# Patient Record
Sex: Female | Born: 1980 | Race: White | Hispanic: No | Marital: Single | Smoking: Former smoker
Health system: Southern US, Community
[De-identification: ages and names within clinical notes are randomized; demographics above are authoritative.]

## PROBLEM LIST (undated history)

## (undated) DIAGNOSIS — E119 Type 2 diabetes mellitus without complications: Secondary | ICD-10-CM

## (undated) HISTORY — DX: Type 2 diabetes mellitus without complications: E11.9

---

## 1999-05-22 ENCOUNTER — Encounter: Admission: RE | Admit: 1999-05-22 | Discharge: 1999-05-22 | Payer: Self-pay | Admitting: Sports Medicine

## 1999-05-22 ENCOUNTER — Encounter: Admission: RE | Admit: 1999-05-22 | Discharge: 1999-05-22 | Payer: Self-pay | Admitting: Family Medicine

## 2001-03-03 ENCOUNTER — Encounter: Admission: RE | Admit: 2001-03-03 | Discharge: 2001-03-03 | Payer: Self-pay | Admitting: Sports Medicine

## 2001-04-18 ENCOUNTER — Encounter: Payer: Self-pay | Admitting: Emergency Medicine

## 2001-04-18 ENCOUNTER — Emergency Department (HOSPITAL_COMMUNITY): Admission: EM | Admit: 2001-04-18 | Discharge: 2001-04-19 | Payer: Self-pay | Admitting: Emergency Medicine

## 2007-05-09 ENCOUNTER — Inpatient Hospital Stay (HOSPITAL_COMMUNITY): Admission: AD | Admit: 2007-05-09 | Discharge: 2007-05-11 | Payer: Self-pay | Admitting: Obstetrics & Gynecology

## 2010-08-16 ENCOUNTER — Encounter: Payer: BC Managed Care – PPO | Attending: Certified Nurse Midwife

## 2010-08-16 DIAGNOSIS — O99810 Abnormal glucose complicating pregnancy: Secondary | ICD-10-CM | POA: Insufficient documentation

## 2010-08-16 DIAGNOSIS — Z713 Dietary counseling and surveillance: Secondary | ICD-10-CM | POA: Insufficient documentation

## 2010-10-18 ENCOUNTER — Inpatient Hospital Stay (HOSPITAL_COMMUNITY)
Admission: AD | Admit: 2010-10-18 | Discharge: 2010-10-19 | DRG: 372 | Disposition: A | Discharge status: Home / Self Care | Payer: BC Managed Care – PPO | Source: Ambulatory Visit | Attending: Obstetrics & Gynecology | Admitting: Obstetrics & Gynecology

## 2010-10-18 DIAGNOSIS — O9903 Anemia complicating the puerperium: Secondary | ICD-10-CM | POA: Diagnosis not present

## 2010-10-18 DIAGNOSIS — D62 Acute posthemorrhagic anemia: Secondary | ICD-10-CM | POA: Diagnosis not present

## 2010-10-18 DIAGNOSIS — O99814 Abnormal glucose complicating childbirth: Secondary | ICD-10-CM | POA: Diagnosis present

## 2010-10-19 LAB — CBC
HCT: 26.4 % — ABNORMAL LOW (ref 36.0–46.0)
Hemoglobin: 9 g/dL — ABNORMAL LOW (ref 12.0–15.0)
MCH: 30.8 pg (ref 26.0–34.0)
MCHC: 34.1 g/dL (ref 30.0–36.0)
MCV: 90.4 fL (ref 78.0–100.0)
Platelets: 140 10*3/uL — ABNORMAL LOW (ref 150–400)
RBC: 2.92 MIL/uL — ABNORMAL LOW (ref 3.87–5.11)
RDW: 13.2 % (ref 11.5–15.5)
WBC: 9 10*3/uL (ref 4.0–10.5)

## 2010-10-19 LAB — RPR: RPR Ser Ql: NONREACTIVE

## 2010-10-19 LAB — ABO/RH: ABO/RH(D): O POS

## 2010-10-24 ENCOUNTER — Inpatient Hospital Stay (HOSPITAL_COMMUNITY): Admission: AD | Admit: 2010-10-24 | Payer: Self-pay | Source: Home / Self Care | Admitting: Obstetrics and Gynecology

## 2010-11-07 NOTE — H&P (Signed)
Alyssa Benjamin, Alyssa Benjamin              ACCOUNT NO.:  [id number]   MEDICAL RECORD NO.:  [id number]          PATIENT TYPE:  INP   LOCATION:  9107                          FACILITY:  [facility]   PHYSICIAN:  Lenoard Aden, M.D.DATE OF BIRTH:  04/30/1981   DATE OF ADMISSION:  05/09/2007  DATE OF DISCHARGE:                              HISTORY & PHYSICAL   CHIEF COMPLAINT:  Spontaneous rupture of membranes at 7:00 a.m.   She is a 30 year old white female, G1, P0, EDC of May 30, 2007 at 37  weeks with spontaneous rupture of membranes, as noted.   She has no known drug allergies.   MEDICATIONS:  Prenatal vitamins.   She is a nonsmoker and nondrinker.  She denies domestic physical  violence.  She had kidney disease as a child.  She has a history of  anemia.  She has a history of pyelonephritis.  She has a family history  of kidney disease, breast cancer, skin cancer, stroke, and alcohol  abuse.  Prenatal course to date has been uncomplicated.   PHYSICAL EXAMINATION:  She is a well-developed and well-nourished white  female in no acute distress.  HEENT:  Normal.  LUNGS:  Clear.  HEART:  Regular rhythm.  ABDOMEN:  Soft, gravid, and nontender.  Estimated fetal weight is 6-1/7  to 7 pounds.  Cervix is 3-4 cm, 80% vertex.  There is clear amniotic fluid noted.   IMPRESSION:  Term intrauterine pregnancy with spontaneous rupture of  membranes.   PLAN:  Anticipated attempts at vaginal delivery.  Given GBS negativity,  we will manage expectantly at this time.  The possibility of Pitocin  augmentation discussed.      Lenoard Aden, M.D.  Electronically Signed     RJT/MEDQ  D:  05/09/2007  T:  05/10/2007  Job:  [id number]

## 2011-04-03 LAB — CBC
Hemoglobin: 11.8 — ABNORMAL LOW
Platelets: 182
RBC: 3.58 — ABNORMAL LOW
RDW: 12.4
RDW: 12.3
WBC: 9.3

## 2013-03-10 ENCOUNTER — Ambulatory Visit: Payer: BC Managed Care – PPO

## 2013-03-10 ENCOUNTER — Ambulatory Visit: Payer: BC Managed Care – PPO | Admitting: Family Medicine

## 2013-03-10 ENCOUNTER — Encounter: Payer: Self-pay | Admitting: Family Medicine

## 2013-03-10 VITALS — BP 104/60 | HR 54 | Temp 97.9°F | Resp 16 | Ht 63.5 in | Wt 115.0 lb

## 2013-03-10 DIAGNOSIS — S91011A Laceration without foreign body, right ankle, initial encounter: Secondary | ICD-10-CM

## 2013-03-10 DIAGNOSIS — S81009A Unspecified open wound, unspecified knee, initial encounter: Secondary | ICD-10-CM

## 2013-03-10 DIAGNOSIS — M25571 Pain in right ankle and joints of right foot: Secondary | ICD-10-CM

## 2013-03-10 DIAGNOSIS — Z23 Encounter for immunization: Secondary | ICD-10-CM

## 2013-03-10 DIAGNOSIS — M25579 Pain in unspecified ankle and joints of unspecified foot: Secondary | ICD-10-CM

## 2013-03-10 MED ORDER — CEPHALEXIN 500 MG PO CAPS: 500.0000 mg | ORAL_CAPSULE | Freq: Four times a day (QID) | ORAL | Status: DC

## 2013-03-10 NOTE — Progress Notes (Signed)
 [facility]:  Office Visit  Chief Complaint:  Chief Complaint  Patient presents with  . Laceration    right ankle- and glass fell off the shelf and broke and a piece of glass cut her ankle. Needs TDAP    HPI: Alyssa Benjamin is a 32 y.o. female who complains of right ankle laceration since this AM, was at the [employer] and getting candy at checkout for students and was grabbing bottle of Seltzer and one of the bottles broke and she was a piece of broken glass went into her ankle. She has some numbness and pain around the area of the cut. Not sure if she is UTD on her tetanus. She had gestational DM , she is an active runner. She is a Runner, broadcasting/film/video .  Past Medical History  Diagnosis Date  . Diabetes mellitus without complication     gestational   History reviewed. No pertinent past surgical history. History   Social History  . Marital Status: Single    Spouse Name: N/A    Number of Children: N/A  . Years of Education: N/A   Social History Main Topics  . Smoking status: Former Games developer  . Smokeless tobacco: None  . Alcohol Use: None  . Drug Use: None  . Sexual Activity: None   Other Topics Concern  . None   Social History Narrative  . None   History reviewed. No pertinent family history. No Known Allergies Prior to Admission medications   Not on File     ROS: The patient denies fevers, chills, night sweats, unintentional weight loss, chest pain, palpitations, wheezing, dyspnea on exertion, nausea, vomiting, abdominal pain, dysuria, hematuria, melena,  weakness  All other systems have been reviewed and were otherwise negative with the exception of those mentioned in the HPI and as above.    PHYSICAL EXAM: Filed Vitals:   03/10/13 0915  BP: 104/60  Pulse: 54  Temp: 97.9 F (36.6 C)  Resp: 16   Filed Vitals:   03/10/13 0915  Height: 5' 3.5" (1.613 m)  Weight: 115 lb (52.164 kg)   Body mass index is 20.05 kg/(m^2).  General: Alert, no acute  distress HEENT:  Normocephalic, atraumatic, oropharynx patent. EOMI, PERRLA Cardiovascular:  Regular rate and rhythm, no rubs murmurs or gallops.  No Carotid bruits, radial pulse intact. No pedal edema.  Respiratory: Clear to auscultation bilaterally.  No wheezes, rales, or rhonchi.  No cyanosis, no use of accessory musculature GI: No organomegaly, abdomen is soft and non-tender, positive bowel sounds.  No masses. Skin: + 2 inch laceration along lateral right ankle.  Neurologic: Facial musculature symmetric. Psychiatric: Patient is appropriate throughout our interaction. Lymphatic: No cervical lymphadenopathy Musculoskeletal: Gait intact. Plantar and dorsiflexion intact 5/5 strength, sensation intact, + DP, + posterior tib artery  LABS: Results for orders placed during the hospital encounter of 10/18/10  CBC      Result Value Range   WBC 9.0  4.0 - 10.5 K/uL   RBC 2.92 (*) 3.87 - 5.11 MIL/uL   Hemoglobin 9.0 (*) 12.0 - 15.0 g/dL   HCT 40.9 (*) 81.1 - 91.4 %   MCV 90.4  78.0 - 100.0 fL   MCH 30.8  26.0 - 34.0 pg   MCHC 34.1  30.0 - 36.0 g/dL   RDW 78.2  95.6 - 21.3 %   Platelets 140 (*) 150 - 400 K/uL  RPR      Result Value Range   RPR NON REACTIVE  NON REACTIVE  ABO/RH      Result Value Range   ABO/RH(D) O POS       EKG/XRAY:   Primary read interpreted by Dr. Conley Rolls at [facility]. No fx or dislocation, no foreign objects Soft tissue swelling    ASSESSMENT/PLAN: Encounter Diagnoses  Name Primary?  . Right ankle pain Yes  . Laceration of right ankle, initial encounter    Rx Keflex since near tendon Short cam walker for comfort TDap given F/u in 10 days for stitch removal Gross sideeffects, risk and benefits, and alternatives of medications d/w patient. Patient is aware that all medications have potential sideeffects and we are unable to predict every sideeffect or drug-drug interaction that may occur.  ,  PHUONG, DO 03/10/2013 10:33 AM

## 2013-03-10 NOTE — Patient Instructions (Addendum)

## 2013-03-10 NOTE — Progress Notes (Signed)
Procedure Note: Verbal consent obtained from the patient.  Local anesthesia with 2 cc Lidocaine 2% with epinephrine.  Wound scrubbed with soap and water.  Wound explored.  No foreign bodies or deep structure injury noted.  Wound closed with #5 sutures of 4-0 ethilon (#2 VM + #3 SI).  Area cleansed and dressed.  Wound care discussed.  Anticipate suture removal in 10 days.

## 2013-03-20 ENCOUNTER — Ambulatory Visit (INDEPENDENT_AMBULATORY_CARE_PROVIDER_SITE_OTHER): Payer: BC Managed Care – PPO | Admitting: Physician Assistant

## 2013-03-20 VITALS — BP 98/60 | HR 60 | Temp 98.7°F | Resp 18 | Ht 63.75 in | Wt 118.6 lb

## 2013-03-20 DIAGNOSIS — S91302D Unspecified open wound, left foot, subsequent encounter: Secondary | ICD-10-CM

## 2013-03-20 DIAGNOSIS — Z5189 Encounter for other specified aftercare: Secondary | ICD-10-CM

## 2013-03-20 NOTE — Progress Notes (Signed)
  Subjective:    Patient ID: Alyssa Benjamin, female    DOB: [birth year], 32 y.o.   MRN: [id number]  HPI 32 year old female presents for suture removal.  DOI 03/10/13.  Doing well without issues or complaints.  Does admit her ankle is still sore and slightly bruised.  Denies erythema, warmth, or drainage.      Review of Systems  Constitutional: Negative for fever and chills.  Skin: Positive for wound.  Neurological: Negative for weakness.       Objective:   Physical Exam  Constitutional: She is oriented to person, place, and time. She appears well-developed and well-nourished.  HENT:  Head: Normocephalic and atraumatic.  Right Ear: External ear normal.  Eyes: Conjunctivae are normal.  Neck: Normal range of motion.  Cardiovascular: Normal rate.   Pulmonary/Chest: Effort normal.  Neurological: She is alert and oriented to person, place, and time.  Skin:     Psychiatric: She has a normal mood and affect. Her behavior is normal. Judgment and thought content normal.          Assessment & Plan:  Wound, open, foot with complication, left, subsequent encounter  Sutures removed.  Wound healing well.  Follow up as needed.

## 2014-08-30 ENCOUNTER — Ambulatory Visit (INDEPENDENT_AMBULATORY_CARE_PROVIDER_SITE_OTHER): Payer: BC Managed Care – PPO | Admitting: Family Medicine

## 2014-08-30 ENCOUNTER — Encounter: Payer: Self-pay | Admitting: Family Medicine

## 2014-08-30 VITALS — BP 111/68 | HR 55 | Temp 97.9°F | Resp 16 | Ht 64.0 in | Wt 115.0 lb

## 2014-08-30 DIAGNOSIS — H578 Other specified disorders of eye and adnexa: Secondary | ICD-10-CM | POA: Diagnosis not present

## 2014-08-30 DIAGNOSIS — H5789 Other specified disorders of eye and adnexa: Secondary | ICD-10-CM

## 2014-08-30 DIAGNOSIS — R05 Cough: Secondary | ICD-10-CM

## 2014-08-30 DIAGNOSIS — R059 Cough, unspecified: Secondary | ICD-10-CM

## 2014-08-30 MED ORDER — ERYTHROMYCIN 5 MG/GM OP OINT
1.0000 | TOPICAL_OINTMENT | Freq: Four times a day (QID) | OPHTHALMIC | Status: AC
Start: 2014-08-30 — End: ?

## 2014-08-30 MED ORDER — ALBUTEROL SULFATE HFA 108 (90 BASE) MCG/ACT IN AERS: 2.0000 | INHALATION_SPRAY | RESPIRATORY_TRACT | Status: AC | PRN

## 2014-08-30 NOTE — Patient Instructions (Signed)
Try over the counter sudafed (generic fine) for congestion/drainage- use in the morning and after lunch as needed Can try afrin nasal spray twice a day for up to 3 days Albuterol inhaler is every 4-6 hours as needed for cough/wheeze/chest tightness If you run fever greater than 101, have severe headaches or persistent drainage, please return for evaluation.

## 2014-08-30 NOTE — Progress Notes (Signed)
   Subjective:    Patient ID: Alyssa Benjamin, female    DOB: 12/31/1980, 34 y.o.   MRN: [id number]  HPI This is a very pleasant 34 yo female who presents today with 3 weeks of nasal drainage and cough. Two weeks ago she had laryngitis for 2 days and a severe headache, both of which have resolved. She noticed clear drainage from her eyes this morning. She has bright yellow nasal drainage, worse in the morning. No sinus pressure this week. A lot of post nasal drainage. Has some cough at night, more during the day with teaching/talking .  Has taken some allegra and benadryl at night. Has a marathon this weekend and been able to train normally the last 2 weeks. This will be her She has had some upper airway tightness with running and cold temperature. This seems to resolve as she gets warm. She has not noticed wheezing. Some cough with running. She has never had asthma or needed an inhaler.  She feels like she has been slowly, but consistently improving. 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   Review of Systems No fevers, no ear pain, no shortness of breath, no chest pain.    Objective:   Physical Exam  Constitutional: She is oriented to person, place, and time. She appears well-developed and well-nourished.  HENT:  Head: Normocephalic and atraumatic.  Right Ear: External ear normal. Tympanic membrane is retracted.  Left Ear: External ear normal. Tympanic membrane is retracted.  Nose: Nose normal.  Mouth/Throat: Oropharynx is clear and moist. No oropharyngeal exudate.  Eyes: Conjunctivae are normal. Right eye exhibits no discharge. Left eye exhibits no discharge.  Cardiovascular: Regular rhythm and normal heart sounds.  Bradycardia present.   Pulmonary/Chest: Effort normal  and breath sounds normal.  Musculoskeletal: Normal range of motion.  Neurological: She is alert and oriented to person, place, and time.  Skin: Skin is warm and dry.  Psychiatric: She has a normal mood and affect. Her behavior is normal. Judgment and thought content normal.  Vitals reviewed. BP 111/68 mmHg  Pulse 55  Temp(Src) 97.9 F (36.6 C) (Oral)  Resp 16  Ht 5\' 4"  (1.626 m)  Wt 115 lb (52.164 kg)  BMI 19.73 kg/m2  SpO2 98%  LMP 08/06/2014     Assessment & Plan:  1. Cough - albuterol (PROVENTIL HFA;VENTOLIN HFA) 108 (90 BASE) MCG/ACT inhaler; Inhale 2 puffs into the lungs every 4 (four) hours as needed for wheezing or shortness of breath (cough, shortness of breath or wheezing.).  Dispense: 1 Inhaler; Refill: 1 - URI that is improving with residual cough. Provided written and verbal instructions concerning decongestant/Afrin/albuterol use and to return to clinic if fever, severe headache, persistent drainage.  2. Eye drainage - Patient's drainage currently clear and conjunctiva not injected. Provided wait and see prescription with instructions for use if signs/symptoms of bacterial conjunctivitis occur while patient is out of town. Stressed importance of good hand washing and not sharing personal items.  - erythromycin ophthalmic ointment; Place 1 application into both eyes 4 (four) times daily. For 7 days  Dispense: 3.5 g; Refill: 0   Emi Belfasteborah B. Louay Myrie, FNP-BC  [facility], [facility]  09/01/2014 9:12 PM

## 2014-09-06 ENCOUNTER — Ambulatory Visit (INDEPENDENT_AMBULATORY_CARE_PROVIDER_SITE_OTHER): Payer: BC Managed Care – PPO | Admitting: Family Medicine

## 2014-09-06 ENCOUNTER — Encounter: Payer: Self-pay | Admitting: Family Medicine

## 2014-09-06 ENCOUNTER — Ambulatory Visit (INDEPENDENT_AMBULATORY_CARE_PROVIDER_SITE_OTHER): Payer: BC Managed Care – PPO

## 2014-09-06 VITALS — BP 117/69 | HR 61 | Temp 98.4°F | Resp 16 | Ht 64.0 in | Wt 118.0 lb

## 2014-09-06 DIAGNOSIS — H1013 Acute atopic conjunctivitis, bilateral: Secondary | ICD-10-CM

## 2014-09-06 DIAGNOSIS — R062 Wheezing: Secondary | ICD-10-CM

## 2014-09-06 DIAGNOSIS — R05 Cough: Secondary | ICD-10-CM

## 2014-09-06 DIAGNOSIS — J309 Allergic rhinitis, unspecified: Secondary | ICD-10-CM

## 2014-09-06 DIAGNOSIS — R059 Cough, unspecified: Secondary | ICD-10-CM

## 2014-09-06 DIAGNOSIS — J988 Other specified respiratory disorders: Secondary | ICD-10-CM | POA: Diagnosis not present

## 2014-09-06 DIAGNOSIS — J45990 Exercise induced bronchospasm: Secondary | ICD-10-CM

## 2014-09-06 DIAGNOSIS — J22 Unspecified acute lower respiratory infection: Secondary | ICD-10-CM

## 2014-09-06 MED ORDER — AZITHROMYCIN 250 MG PO TABS: ORAL_TABLET | ORAL | Status: AC

## 2014-09-06 NOTE — Patient Instructions (Addendum)
Saline nasal spray atleast 4 times per day, over the counter flonase if needed for allergies.  Zpak for bronchitis.  There is some information below on asthma/wheezing with exercise. Albuterol inhaler up to 2 puffs every 4 hours if needed. If you still require this later this week, or having to use more than 2-3 times per day, call me and I can send in a prescription for prednisone.  Return to the clinic or go to the nearest emergency room if any of your symptoms worsen or new symptoms occur. Exercise-Induced Bronchospasm A bronchospasm is a condition that is commonly caused by exercise, in which the muscles around the bronchioles (airways to the lungs) tighten, causing the airway to constrict. Exercise-induced bronchospasms are usually associated with short periods of vigorous activity. Many people who experience an exercise-induced bronchospasm may not notice at the time of the event; however, the athlete may later experience symptoms that negatively affect training and performance. SYMPTOMS   High-pitched sounds with breathing (wheezing).  Coughing.  Increased work of breathing (dyspnea).  Rapid breathing (hyperventilation).  Chest pain.  Symptoms occurring 4 to 6 hours after exercise is completed (late-phase reaction). CAUSES  It is not known why certain individuals experience bronchospasms. Respiratory specialists currently think that the cool or dry air breathed in may cause damage to the lining of the bronchioles, which elicits an inflammatory response. The inflammation causes the airways to narrow and symptoms then occur. RISK INCREASES WITH:  Viral infections.  Exercise in cold air.  Exercise in dry conditions.  Poor physical fitness.  High-intensity exercise.  No warm-up before play.  Frequent exposure to substances that produce allergic reactions (allergens). PREVENTION   Improve conditioning.  Treat allergies.  Breathe warm air (cover mouth and nose with a towel  or scarf).  Warm up for an appropriate period of time before physical activity.  Gradually decrease intensity (warm down) for an appropriate period of time after physical activity. PROGNOSIS  Most people with exercise-induced asthma respond well to medication. Patients are typically prescribed an inhaler to treat bronchospasms. However, if symptoms persist despite treatment and continue to affect performance, individuals may need to consider avoiding activities that produce symptoms. RELATED COMPLICATIONS   Decreased athletic performance.  Inability to condition as well as expected.  Side effects from medications. TREATMENT   Maintain physical fitness.  Run with a scarf or towel over your mouth in cold, dry air.  Complete at least 10 minutes of warm-up before high-intensity exercise.  Warm down after play.  Treat allergies. MEDICATION  The usual initial medication is an albuterol inhaler, which expands the constricted bronchioles.  The second-line medication is inhaled corticosteroids, which reduce inflammation in the airway.  Alternative medications included sodium cromoglycate and nedocromil inhalers.  Long-acting medications such as salmeterol can also be used as second-line medications. ACTIVITY  If medications are able to treat the offending symptoms, then no activity modification is required. If you know you will be training or competing in cold or dry climates take extra precautions to prevent symptoms. DIET  No specific diet is recommended.  SEEK MEDICAL CARE IF:   Greater than normal fatigue with exercise.  Greater than normal difficulty breathing occurs with exercise.  Increased wheezing with exercise.  You appear to be breathing harder and faster than expected with training.  Allergies appear to be uncontrollable.  You experience chest pain with exercise. Document Released: 06/11/2005 Document Revised: 10/26/2013 Document Reviewed: 09/23/2008 [facility]  Patient Information 2015 [place], [place]. This information is not  intended to replace advice given to you by your health care provider. Make sure you discuss any questions you have with your health care provider.   Acute Bronchitis Bronchitis is inflammation of the airways that extend from the windpipe into the lungs (bronchi). The inflammation often causes mucus to develop. This leads to a cough, which is the most common symptom of bronchitis.  In acute bronchitis, the condition usually develops suddenly and goes away over time, usually in a couple weeks. Smoking, allergies, and asthma can make bronchitis worse. Repeated episodes of bronchitis may cause further lung problems.  CAUSES Acute bronchitis is most often caused by the same virus that causes a cold. The virus can spread from person to person (contagious) through coughing, sneezing, and touching contaminated objects. SIGNS AND SYMPTOMS   Cough.   Fever.   Coughing up mucus.   Body aches.   Chest congestion.   Chills.   Shortness of breath.   Sore throat.  DIAGNOSIS  Acute bronchitis is usually diagnosed through a physical exam. Your health care provider will also ask you questions about your medical history. Tests, such as chest X-rays, are sometimes done to rule out other conditions.  TREATMENT  Acute bronchitis usually goes away in a couple weeks. Oftentimes, no medical treatment is necessary. Medicines are sometimes given for relief of fever or cough. Antibiotic medicines are usually not needed but may be prescribed in certain situations. In some cases, an inhaler may be recommended to help reduce shortness of breath and control the cough. A cool mist vaporizer may also be used to help thin bronchial secretions and make it easier to clear the chest.  HOME CARE INSTRUCTIONS  Get plenty of rest.   Drink enough fluids to keep your urine clear or pale yellow (unless you have a medical condition that requires fluid  restriction). Increasing fluids may help thin your respiratory secretions (sputum) and reduce chest congestion, and it will prevent dehydration.   Take medicines only as directed by your health care provider.  If you were prescribed an antibiotic medicine, finish it all even if you start to feel better.  Avoid smoking and secondhand smoke. Exposure to cigarette smoke or irritating chemicals will make bronchitis worse. If you are a smoker, consider using nicotine gum or skin patches to help control withdrawal symptoms. Quitting smoking will help your lungs heal faster.   Reduce the chances of another bout of acute bronchitis by washing your hands frequently, avoiding people with cold symptoms, and trying not to touch your hands to your mouth, nose, or eyes.   Keep all follow-up visits as directed by your health care provider.  SEEK MEDICAL CARE IF: Your symptoms do not improve after 1 week of treatment.  SEEK IMMEDIATE MEDICAL CARE IF:  You develop an increased fever or chills.   You have chest pain.   You have severe shortness of breath.  You have bloody sputum.   You develop dehydration.  You faint or repeatedly feel like you are going to pass out.  You develop repeated vomiting.  You develop a severe headache. MAKE SURE YOU:   Understand these instructions.  Will watch your condition.  Will get help right away if you are not doing well or get worse. Document Released: 07/19/2004 Document Revised: 10/26/2013 Document Reviewed: 12/02/2012 [facility] Patient Information 2015 [place]. This information is not intended to replace advice given to you by your health care provider. Make sure you discuss any questions you have with your  health care provider.  

## 2014-09-06 NOTE — Progress Notes (Addendum)
Subjective:  This chart was scribed for Alyssa Staggers, MD by Alyssa Benjamin, ED Scribe at [facility]. The patient was seen in exam room 24 and the patient's care was started at 1:11 PM.   Patient ID: Alyssa Benjamin, female    DOB: [birth year], 34 y.o.   MRN: [id number] Chief Complaint  Patient presents with  . Follow-up    short of breath running a marathon on Saturday and pulse ox got down to 86 and had to be put on oxygen and had to be given albuterol treatments  . Follow-up    eyes are doing alittle better.  they still have some crust in the mornings.   HPI HPI Comments: Alyssa Benjamin is a 34 y.o. female who presents to [facility] for a follow up. She was seen last week at that time she had a cough and nasal discharge for 3 weeks with previous laryngitis and 2 days with an inability to speak but this has resolved. Pt still has discolored drainage and a cough at night or with talking. Pt was training for marathon which she ran this past weekend. Prior to the marathon she noticed upper airway tightness with wheezing. Pt has an inability to get a new breath. At her marathon she was stopped at mile 20. She was experiencing difficulty breathing and she was given first oxygen than a nebulizer treatment which improved her symptoms. Her pulse ox was at 86%. Today she is experiencing some chest tightness. She has a history of seasonal allergies and takes allergia for no relief. She has experienced some chest tightness but not to the extent she experienced during her marathon. No chest tightness or chest pain on exertion. Pt is also concerned about an early conjunctivitis. Saturday and Sunday her eyes were more red than normal with crusting in the morning but no yellow/green discharge. Her eyes have been watering through out the day. She also noticed so redness on both elbows. She was given erythromycin ointment if needed and an albuterol inhaler for suspected  reactive airway disease. She denies fever and chills.   There are no active problems to display for this patient.  Past Medical History  Diagnosis Date  . Diabetes mellitus without complication     gestational   No past surgical history on file. No Known Allergies Prior to Admission medications   Medication Sig Start Date End Date Taking? Authorizing Provider  albuterol (PROVENTIL HFA;VENTOLIN HFA) 108 (90 BASE) MCG/ACT inhaler Inhale 2 puffs into the lungs every 4 (four) hours as needed for wheezing or shortness of breath (cough, shortness of breath or wheezing.). 08/30/14  Yes Alyssa Belfast, FNP  fexofenadine (ALLEGRA) 180 MG tablet Take 180 mg by mouth daily.   Yes Historical Provider, MD  oxymetazoline (AFRIN) 0.05 % nasal spray Place 1 spray into both nostrils 2 (two) times daily.   Yes Historical Provider, MD  phenylephrine (SUDAFED PE) 10 MG TABS tablet Take 10 mg by mouth every 4 (four) hours as needed.   Yes Historical Provider, MD  erythromycin ophthalmic ointment Place 1 application into both eyes 4 (four) times daily. For 7 days Patient not taking: Reported on 09/06/2014 08/30/14   Alyssa Belfast, FNP   History   Social History  . Marital Status: Single    Spouse Name: N/A  . Number of Children: N/A  . Years of Education: N/A   Occupational History  . Not on file.   Social History  Main Topics  . Smoking status: Former Games developer  . Smokeless tobacco: Not on file  . Alcohol Use: Yes  . Drug Use: No  . Sexual Activity: Not on file   Other Topics Concern  . Not on file   Social History Narrative   Review of Systems  Constitutional: Negative for fever and chills.  HENT: Positive for congestion and rhinorrhea.   Respiratory: Positive for cough, chest tightness, shortness of breath and wheezing.        Objective:  BP 117/69 mmHg  Pulse 61  Temp(Src) 98.4 F (36.9 C) (Oral)  Resp 16  Ht 5\' 4"  (1.626 m)  Wt 118 lb (53.524 kg)  BMI 20.24 kg/m2  SpO2 100%   LMP 08/06/2014  Physical Exam  Constitutional: She is oriented to person, place, and time. She appears well-developed and well-nourished. No distress.  HENT:  Head: Normocephalic and atraumatic.  Right Ear: Hearing, tympanic membrane, external ear and ear canal normal.  Left Ear: Hearing, tympanic membrane, external ear and ear canal normal.  Nose: Nose normal.  Mouth/Throat: Oropharynx is clear and moist. No oropharyngeal exudate.  Eyes: Conjunctivae and EOM are normal. Pupils are equal, round, and reactive to light. Right eye exhibits no discharge and no exudate. Left eye exhibits no discharge and no exudate. Right conjunctiva is not injected. Left conjunctiva is not injected. No scleral icterus.  Neck: Normal range of motion.  Cardiovascular: Normal rate, regular rhythm, normal heart sounds and intact distal pulses.   No murmur heard. Pulmonary/Chest: Effort normal and breath sounds normal. No respiratory distress. She has no wheezes. She has no rhonchi.  Clear initially but some wheeze at left lower lobe.  Musculoskeletal: Normal range of motion.  Neurological: She is alert and oriented to person, place, and time.  Skin: Skin is warm and dry. No rash noted.  Psychiatric: She has a normal mood and affect. Her behavior is normal.  Nursing note and vitals reviewed.  Peak flow reading is 350, about 83 % of predicted.  [facility] reading (PRIMARY) by  Dr. Neva Benjamin: CXR: few increased RLL.RML markings.       Assessment & Plan:   Alyssa Benjamin is a 34 y.o. female  Cough, Wheezing, LRTI (lower respiratory tract infection), Exercise-induced bronchospasm  -3 week history of cough, with initial viral etiology likley, but persistent wheeze/reactive airway component, and questionable increased markings in RLL (heard faint coarse breath sound and wheeze here on exam).  Possible asthmatic bronchitis or bronchitis with reactive airway exacerbated by cold air at recent race.   -Zpak #1  -albuterol up  to every 4-6 hours. Can also use prior to exercise. Discussed pause in running for next few days and "neck check rule" discussed. If persistent albuterol need later this week - plan to call in 5 day burst of prednisone. SED, RTC precautions discussed.   Allergic rhinitis, unspecified allergic rhinitis type - Plan: DG Chest 2 View  -cont allegra, saline NS as needed, and add Flonase if sx's persist.   Allergic conjunctivitis, bilateral  -improved. No indication for abx gtts at this time.    Meds ordered this encounter  . azithromycin (ZITHROMAX) 250 MG tablet    Sig: Take 2 pills by mouth on day 1, then 1 pill by mouth per day on days 2 through 5.    Dispense:  6 tablet    Refill:  0   Patient Instructions  Saline nasal spray atleast 4 times per day, over the counter flonase if needed for  allergies.  Zpak for bronchitis.  There is some information below on asthma/wheezing with exercise. Albuterol inhaler up to 2 puffs every 4 hours if needed. If you still require this later this week, or having to use more than 2-3 times per day, call me and I can send in a prescription for prednisone.  Return to the clinic or go to the nearest emergency room if any of your symptoms worsen or new symptoms occur. Exercise-Induced Bronchospasm A bronchospasm is a condition that is commonly caused by exercise, in which the muscles around the bronchioles (airways to the lungs) tighten, causing the airway to constrict. Exercise-induced bronchospasms are usually associated with short periods of vigorous activity. Many people who experience an exercise-induced bronchospasm may not notice at the time of the event; however, the athlete may later experience symptoms that negatively affect training and performance. SYMPTOMS   High-pitched sounds with breathing (wheezing).  Coughing.  Increased work of breathing (dyspnea).  Rapid breathing (hyperventilation).  Chest pain.  Symptoms occurring 4 to 6 hours after  exercise is completed (late-phase reaction). CAUSES  It is not known why certain individuals experience bronchospasms. Respiratory specialists currently think that the cool or dry air breathed in may cause damage to the lining of the bronchioles, which elicits an inflammatory response. The inflammation causes the airways to narrow and symptoms then occur. RISK INCREASES WITH:  Viral infections.  Exercise in cold air.  Exercise in dry conditions.  Poor physical fitness.  High-intensity exercise.  No warm-up before play.  Frequent exposure to substances that produce allergic reactions (allergens). PREVENTION   Improve conditioning.  Treat allergies.  Breathe warm air (cover mouth and nose with a towel or scarf).  Warm up for an appropriate period of time before physical activity.  Gradually decrease intensity (warm down) for an appropriate period of time after physical activity. PROGNOSIS  Most people with exercise-induced asthma respond well to medication. Patients are typically prescribed an inhaler to treat bronchospasms. However, if symptoms persist despite treatment and continue to affect performance, individuals may need to consider avoiding activities that produce symptoms. RELATED COMPLICATIONS   Decreased athletic performance.  Inability to condition as well as expected.  Side effects from medications. TREATMENT   Maintain physical fitness.  Run with a scarf or towel over your mouth in cold, dry air.  Complete at least 10 minutes of warm-up before high-intensity exercise.  Warm down after play.  Treat allergies. MEDICATION  The usual initial medication is an albuterol inhaler, which expands the constricted bronchioles.  The second-line medication is inhaled corticosteroids, which reduce inflammation in the airway.  Alternative medications included sodium cromoglycate and nedocromil inhalers.  Long-acting medications such as salmeterol can also be used  as second-line medications. ACTIVITY  If medications are able to treat the offending symptoms, then no activity modification is required. If you know you will be training or competing in cold or dry climates take extra precautions to prevent symptoms. DIET  No specific diet is recommended.  SEEK MEDICAL CARE IF:   Greater than normal fatigue with exercise.  Greater than normal difficulty breathing occurs with exercise.  Increased wheezing with exercise.  You appear to be breathing harder and faster than expected with training.  Allergies appear to be uncontrollable.  You experience chest pain with exercise. Document Released: 06/11/2005 Document Revised: 10/26/2013 Document Reviewed: 09/23/2008 [facility] Patient Information 2015 [place], [place]. This information is not intended to replace advice given to you by your health care provider. Make sure you  discuss any questions you have with your health care provider.   Acute Bronchitis Bronchitis is inflammation of the airways that extend from the windpipe into the lungs (bronchi). The inflammation often causes mucus to develop. This leads to a cough, which is the most common symptom of bronchitis.  In acute bronchitis, the condition usually develops suddenly and goes away over time, usually in a couple weeks. Smoking, allergies, and asthma can make bronchitis worse. Repeated episodes of bronchitis may cause further lung problems.  CAUSES Acute bronchitis is most often caused by the same virus that causes a cold. The virus can spread from person to person (contagious) through coughing, sneezing, and touching contaminated objects. SIGNS AND SYMPTOMS   Cough.   Fever.   Coughing up mucus.   Body aches.   Chest congestion.   Chills.   Shortness of breath.   Sore throat.  DIAGNOSIS  Acute bronchitis is usually diagnosed through a physical exam. Your health care provider will also ask you questions about your medical  history. Tests, such as chest X-rays, are sometimes done to rule out other conditions.  TREATMENT  Acute bronchitis usually goes away in a couple weeks. Oftentimes, no medical treatment is necessary. Medicines are sometimes given for relief of fever or cough. Antibiotic medicines are usually not needed but may be prescribed in certain situations. In some cases, an inhaler may be recommended to help reduce shortness of breath and control the cough. A cool mist vaporizer may also be used to help thin bronchial secretions and make it easier to clear the chest.  HOME CARE INSTRUCTIONS  Get plenty of rest.   Drink enough fluids to keep your urine clear or pale yellow (unless you have a medical condition that requires fluid restriction). Increasing fluids may help thin your respiratory secretions (sputum) and reduce chest congestion, and it will prevent dehydration.   Take medicines only as directed by your health care provider.  If you were prescribed an antibiotic medicine, finish it all even if you start to feel better.  Avoid smoking and secondhand smoke. Exposure to cigarette smoke or irritating chemicals will make bronchitis worse. If you are a smoker, consider using nicotine gum or skin patches to help control withdrawal symptoms. Quitting smoking will help your lungs heal faster.   Reduce the chances of another bout of acute bronchitis by washing your hands frequently, avoiding people with cold symptoms, and trying not to touch your hands to your mouth, nose, or eyes.   Keep all follow-up visits as directed by your health care provider.  SEEK MEDICAL CARE IF: Your symptoms do not improve after 1 week of treatment.  SEEK IMMEDIATE MEDICAL CARE IF:  You develop an increased fever or chills.   You have chest pain.   You have severe shortness of breath.  You have bloody sputum.   You develop dehydration.  You faint or repeatedly feel like you are going to pass out.  You  develop repeated vomiting.  You develop a severe headache. MAKE SURE YOU:   Understand these instructions.  Will watch your condition.  Will get help right away if you are not doing well or get worse. Document Released: 07/19/2004 Document Revised: 10/26/2013 Document Reviewed: 12/02/2012 [facility] Patient Information 2015 [place]. This information is not intended to replace advice given to you by your health care provider. Make sure you discuss any questions you have with your health care provider.       I personally performed the services  described in this documentation, which was scribed in my presence. The recorded information has been reviewed and considered, and addended by me as needed.

## 2015-01-05 IMAGING — CR DG ANKLE 2V *R*
2 series · 2 of 2 positions shown · non-contrast
Comparison: None.

CLINICAL DATA: Cut with a piece of glass. Right ankle laceration.

EXAM:
RIGHT ANKLE - 2 VIEW

[AP]
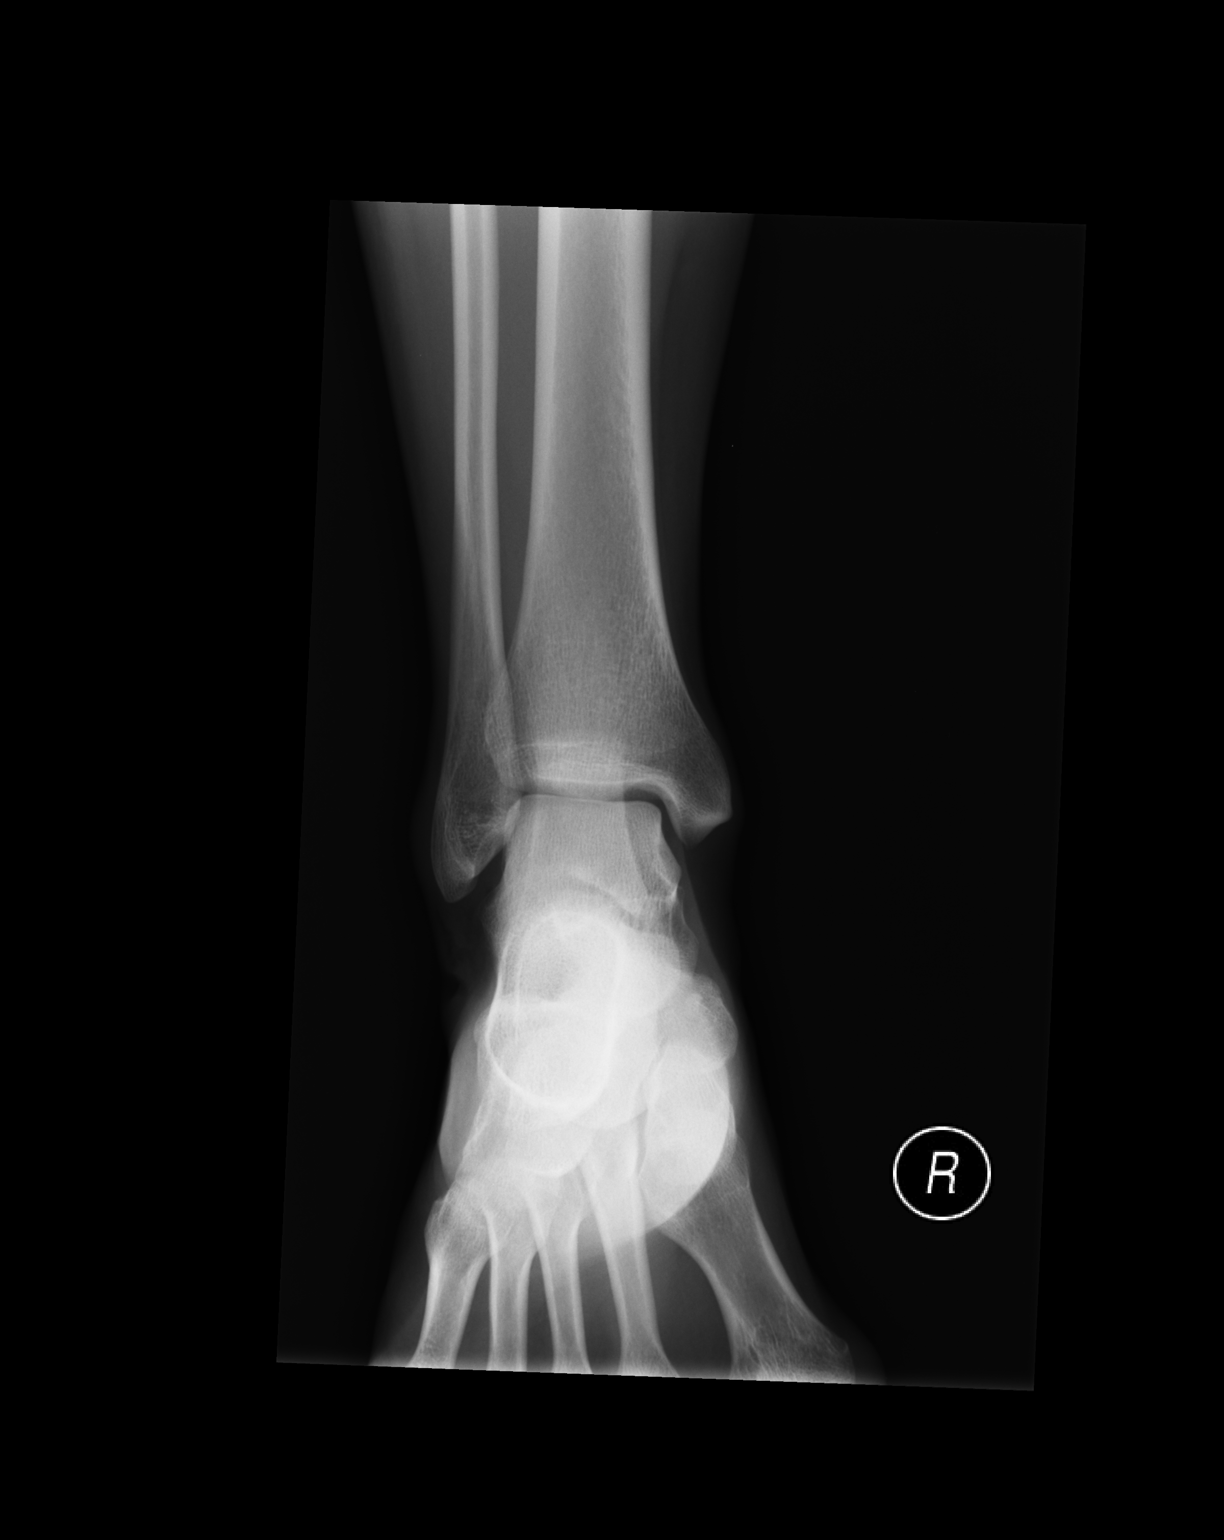

[lateral]
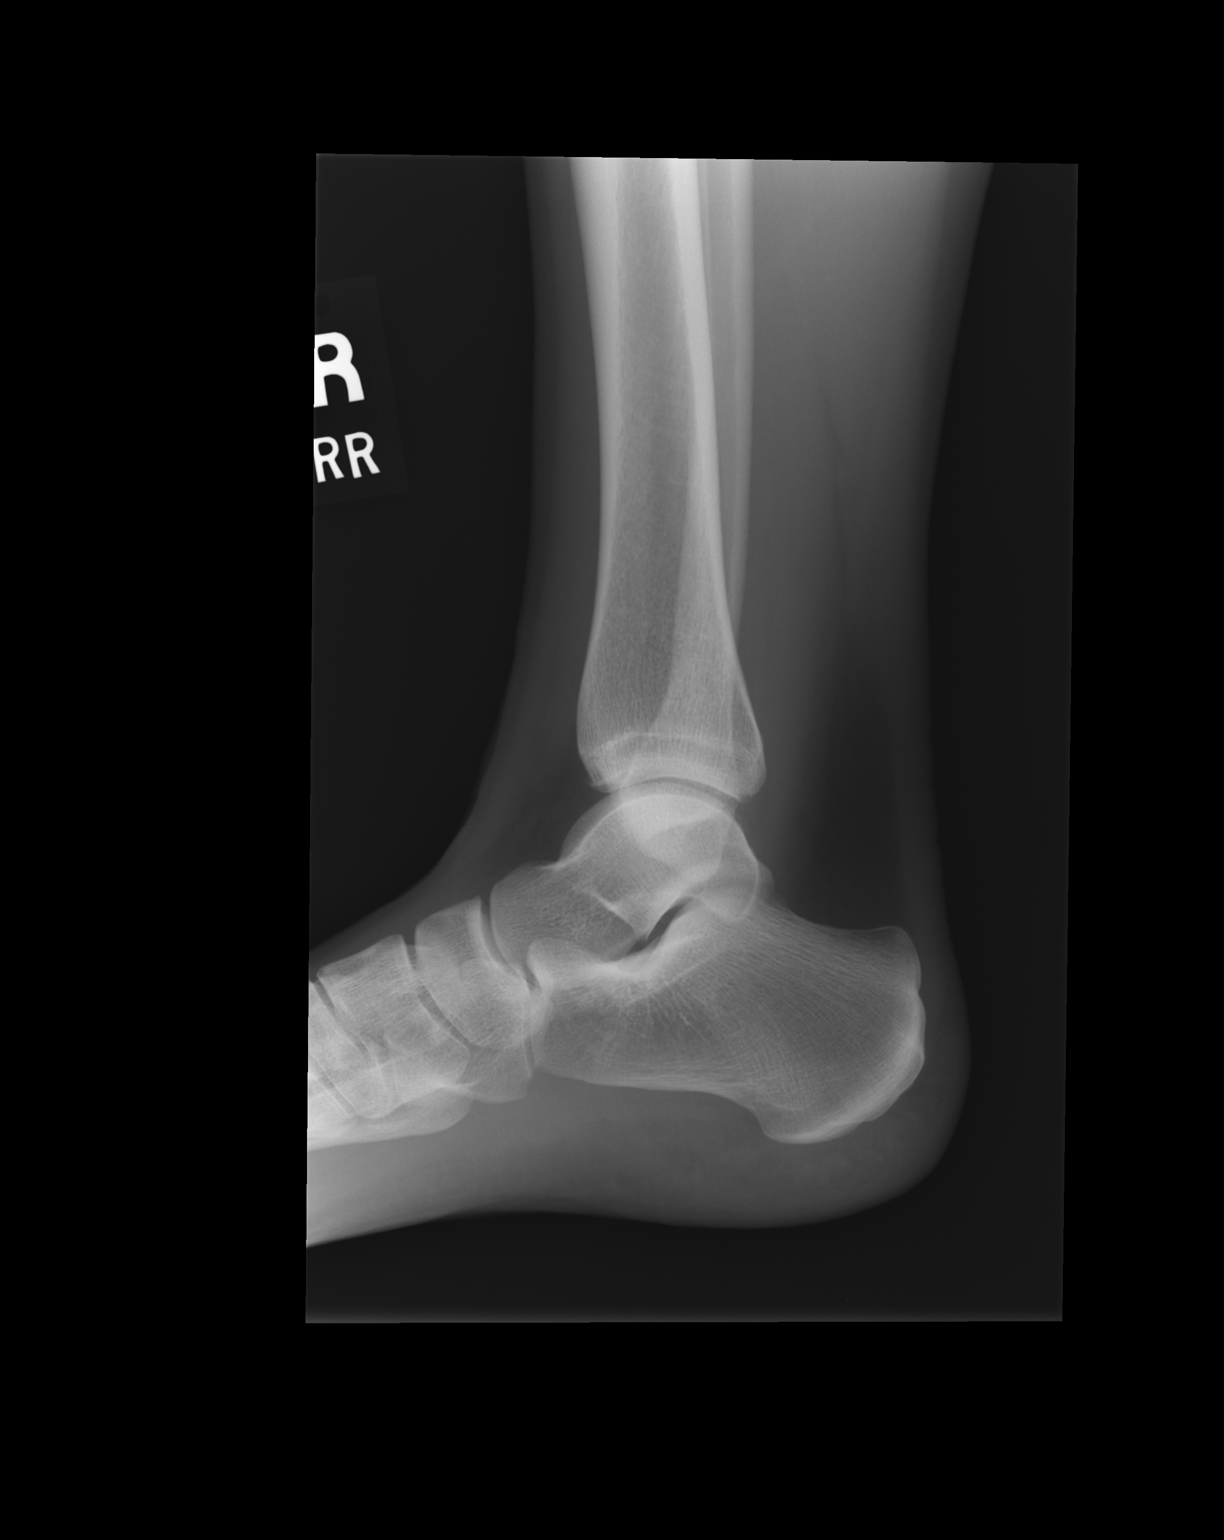

[2 of 2 positions shown; findings below may reference images not displayed]

FINDINGS: There is no evidence of fracture, dislocation, or joint effusion.
There is no evidence of arthropathy or other focal bone abnormality.
No radiopaque foreign body.
IMPRESSION: No fracture or radiopaque foreign body

## 2016-07-03 IMAGING — CR DG CHEST 2V
2 series · 2 of 2 positions shown · non-contrast
Comparison: None.

CLINICAL DATA: 33-year-old female with cough and runny nose for
several weeks. Wheezing. Initial encounter.

EXAM:
CHEST  2 VIEW

[PA]
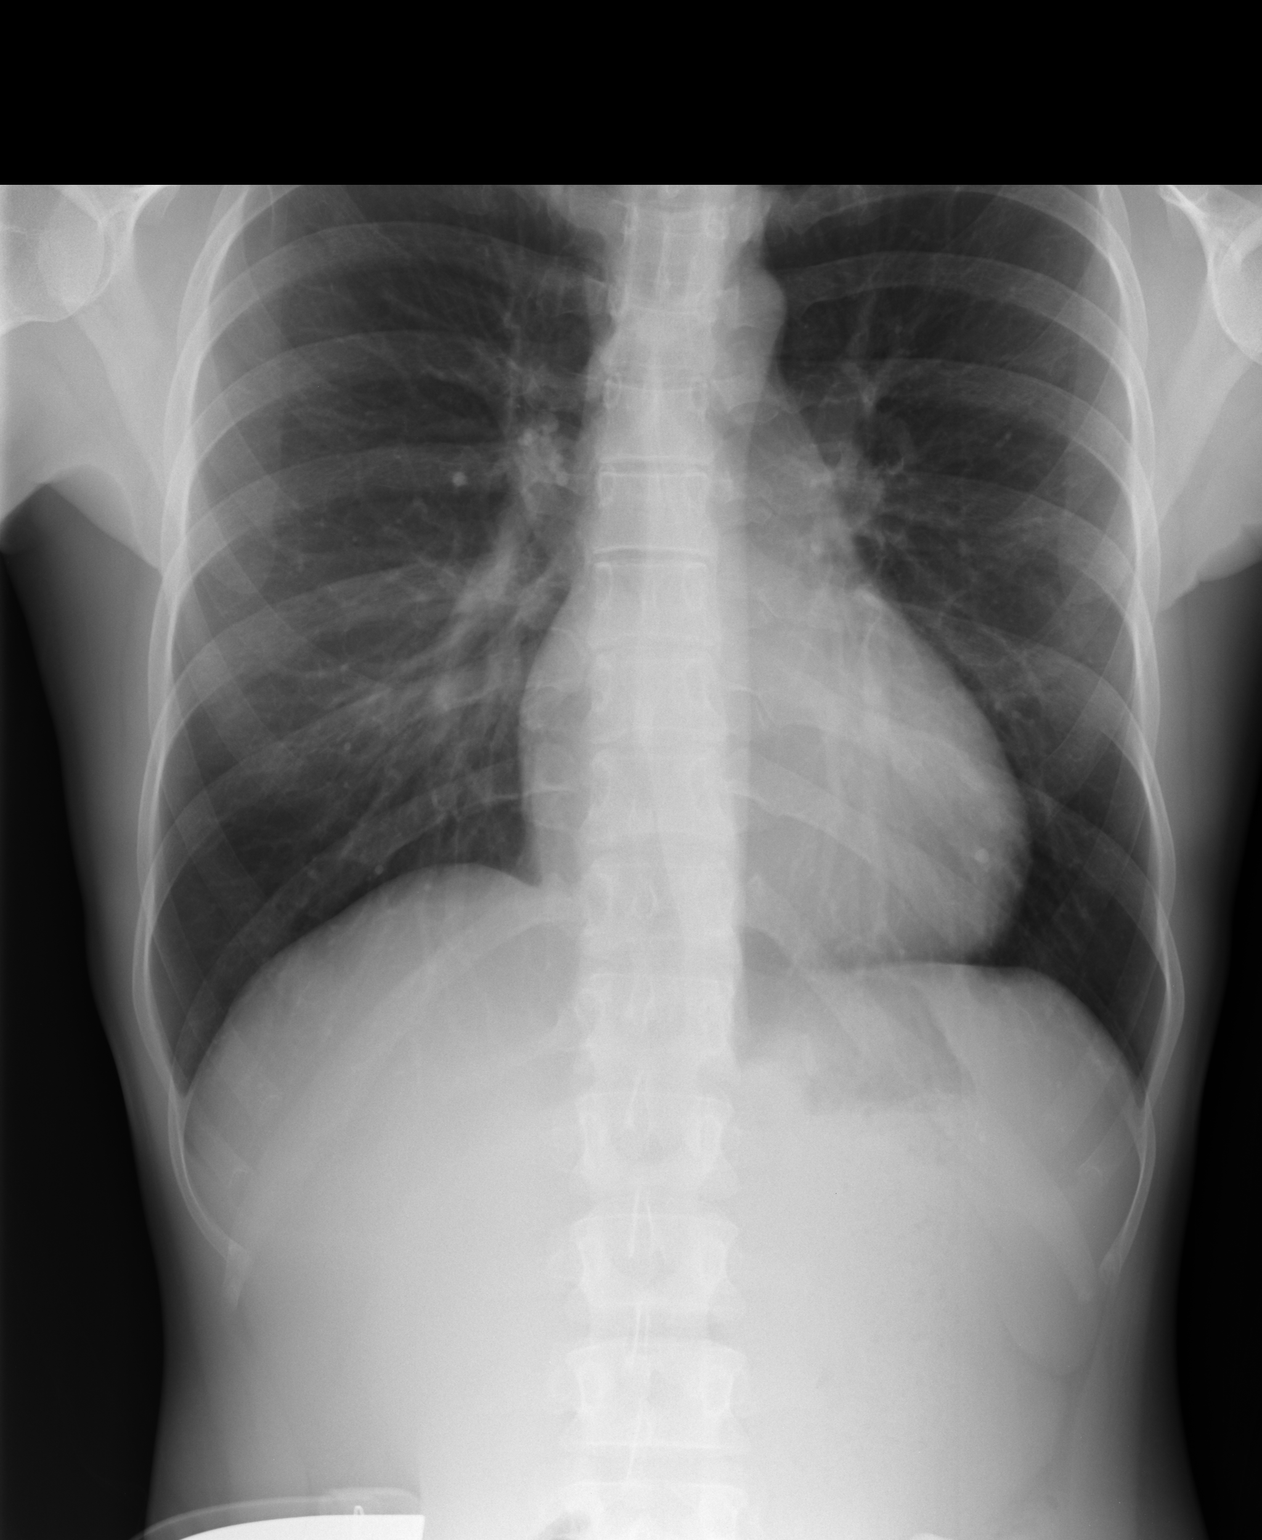

[lateral]
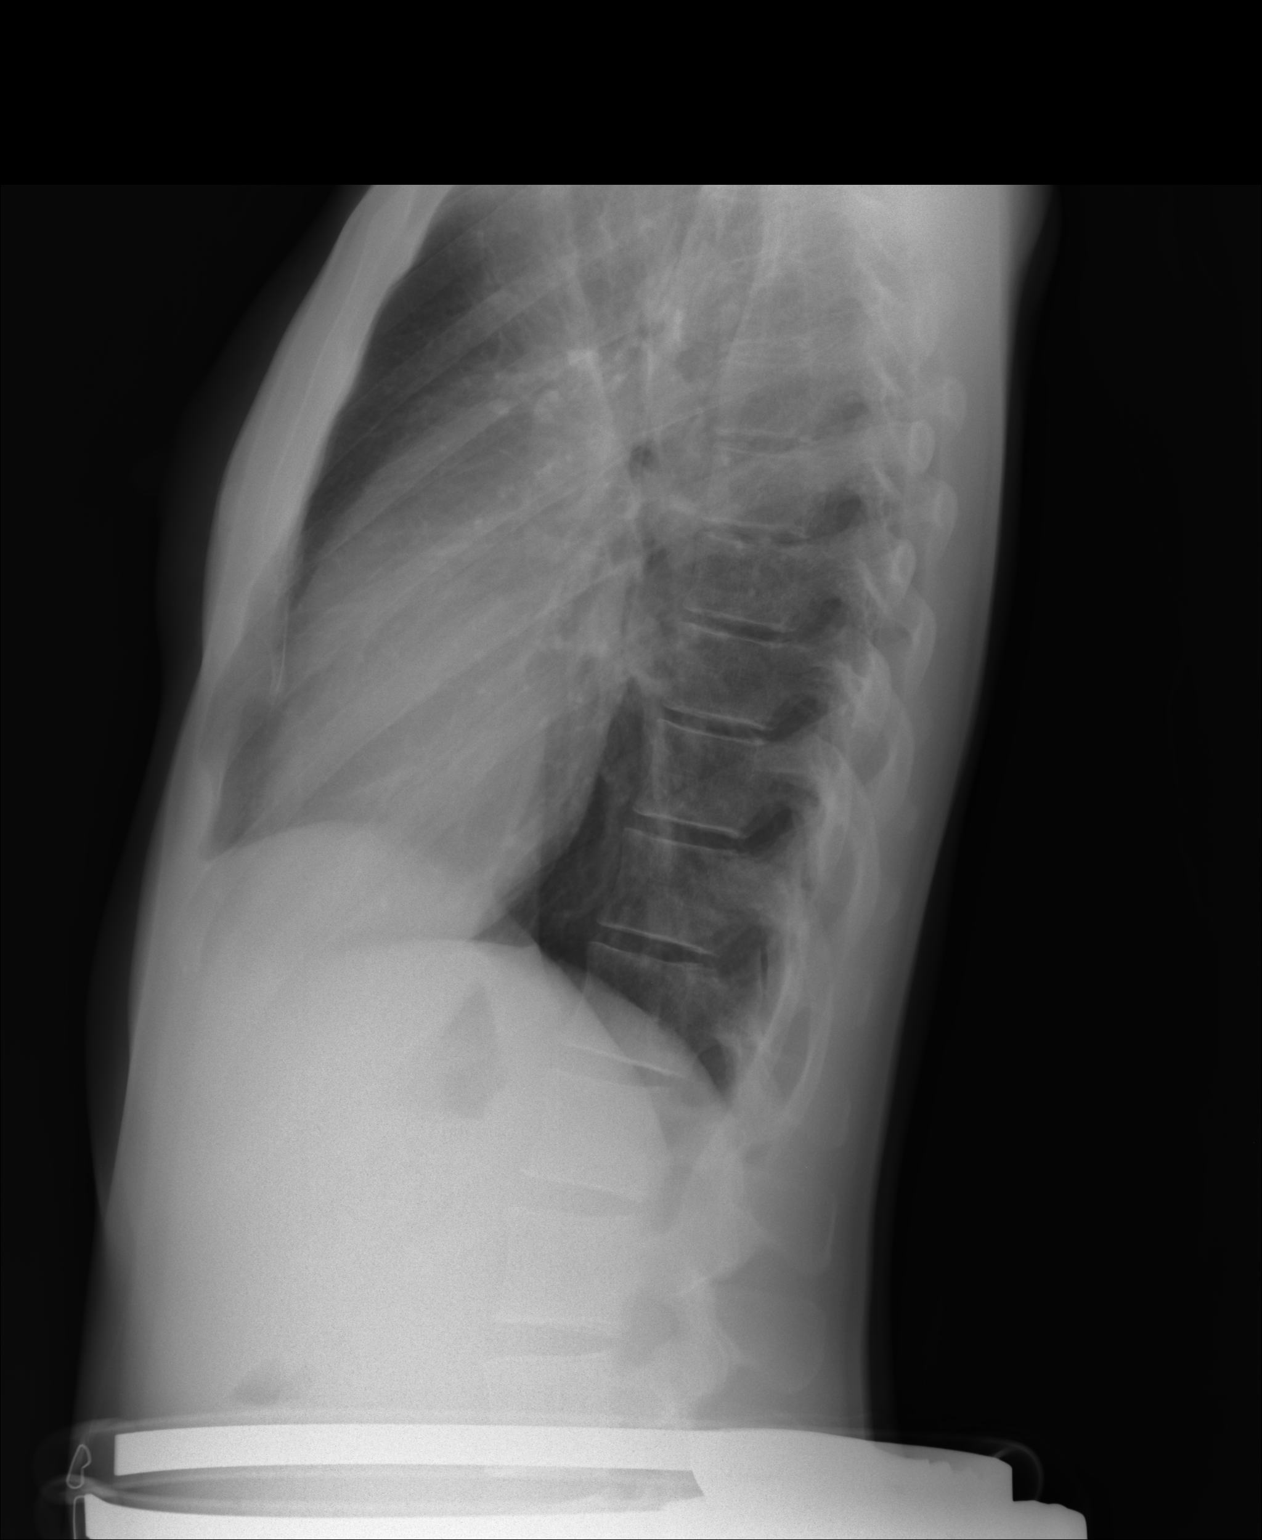

[2 of 2 positions shown; findings below may reference images not displayed]

FINDINGS: Lung volumes are within normal limits. Normal cardiac size and
mediastinal contours. Visualized tracheal air column is within
normal limits. No pneumothorax, pulmonary edema, pleural effusion or
consolidation. No confluent pulmonary opacity. No osseous
abnormality identified.
IMPRESSION: Negative, no acute cardiopulmonary abnormality.
